# Patient Record
Sex: Male | Born: 1970
Health system: Southern US, Community
[De-identification: ages and names within clinical notes are randomized; demographics above are authoritative.]

## PROBLEM LIST (undated history)

## (undated) DIAGNOSIS — T7840XA Allergy, unspecified, initial encounter: Secondary | ICD-10-CM

## (undated) DIAGNOSIS — M722 Plantar fascial fibromatosis: Secondary | ICD-10-CM

## (undated) DIAGNOSIS — F419 Anxiety disorder, unspecified: Secondary | ICD-10-CM

## (undated) DIAGNOSIS — K219 Gastro-esophageal reflux disease without esophagitis: Secondary | ICD-10-CM

## (undated) HISTORY — DX: Plantar fascial fibromatosis: M72.2

## (undated) HISTORY — DX: Anxiety disorder, unspecified: F41.9

## (undated) HISTORY — PX: TONSILLECTOMY: SUR1361

## (undated) HISTORY — PX: OTHER SURGICAL HISTORY: SHX169

## (undated) HISTORY — DX: Allergy, unspecified, initial encounter: T78.40XA

## (undated) HISTORY — DX: Gastro-esophageal reflux disease without esophagitis: K21.9

## (undated) HISTORY — PX: SEPTOPLASTY: SUR1290

## (undated) HISTORY — PX: CHOLECYSTECTOMY: SHX55

---

## 2003-11-26 ENCOUNTER — Encounter: Admission: RE | Admit: 2003-11-26 | Discharge: 2003-11-26 | Payer: Self-pay | Admitting: Family Medicine

## 2003-12-22 ENCOUNTER — Observation Stay (HOSPITAL_COMMUNITY): Admission: EM | Admit: 2003-12-22 | Discharge: 2003-12-23 | Payer: Self-pay | Admitting: Emergency Medicine

## 2006-01-20 ENCOUNTER — Emergency Department (HOSPITAL_COMMUNITY): Admission: EM | Admit: 2006-01-20 | Discharge: 2006-01-20 | Payer: Self-pay | Admitting: Emergency Medicine

## 2007-01-03 ENCOUNTER — Encounter: Admission: RE | Admit: 2007-01-03 | Discharge: 2007-01-03 | Payer: Self-pay | Admitting: Family Medicine

## 2007-01-06 ENCOUNTER — Emergency Department (HOSPITAL_COMMUNITY): Admission: EM | Admit: 2007-01-06 | Discharge: 2007-01-06 | Payer: Self-pay | Admitting: Emergency Medicine

## 2007-12-30 ENCOUNTER — Ambulatory Visit (HOSPITAL_BASED_OUTPATIENT_CLINIC_OR_DEPARTMENT_OTHER): Admission: RE | Admit: 2007-12-30 | Discharge: 2007-12-30 | Payer: Self-pay | Admitting: Orthopedic Surgery

## 2008-07-30 ENCOUNTER — Emergency Department (HOSPITAL_BASED_OUTPATIENT_CLINIC_OR_DEPARTMENT_OTHER): Admission: EM | Admit: 2008-07-30 | Discharge: 2008-07-30 | Payer: Self-pay | Admitting: Emergency Medicine

## 2008-08-25 IMAGING — CR DG CHEST 2V
2 series · 2 of 2 positions shown · non-contrast
Comparison: None.

CLINICAL DATA: Cough, fever, and congestion. 
 CHEST - 2 VIEW:

[view not recorded (1 of 2)]
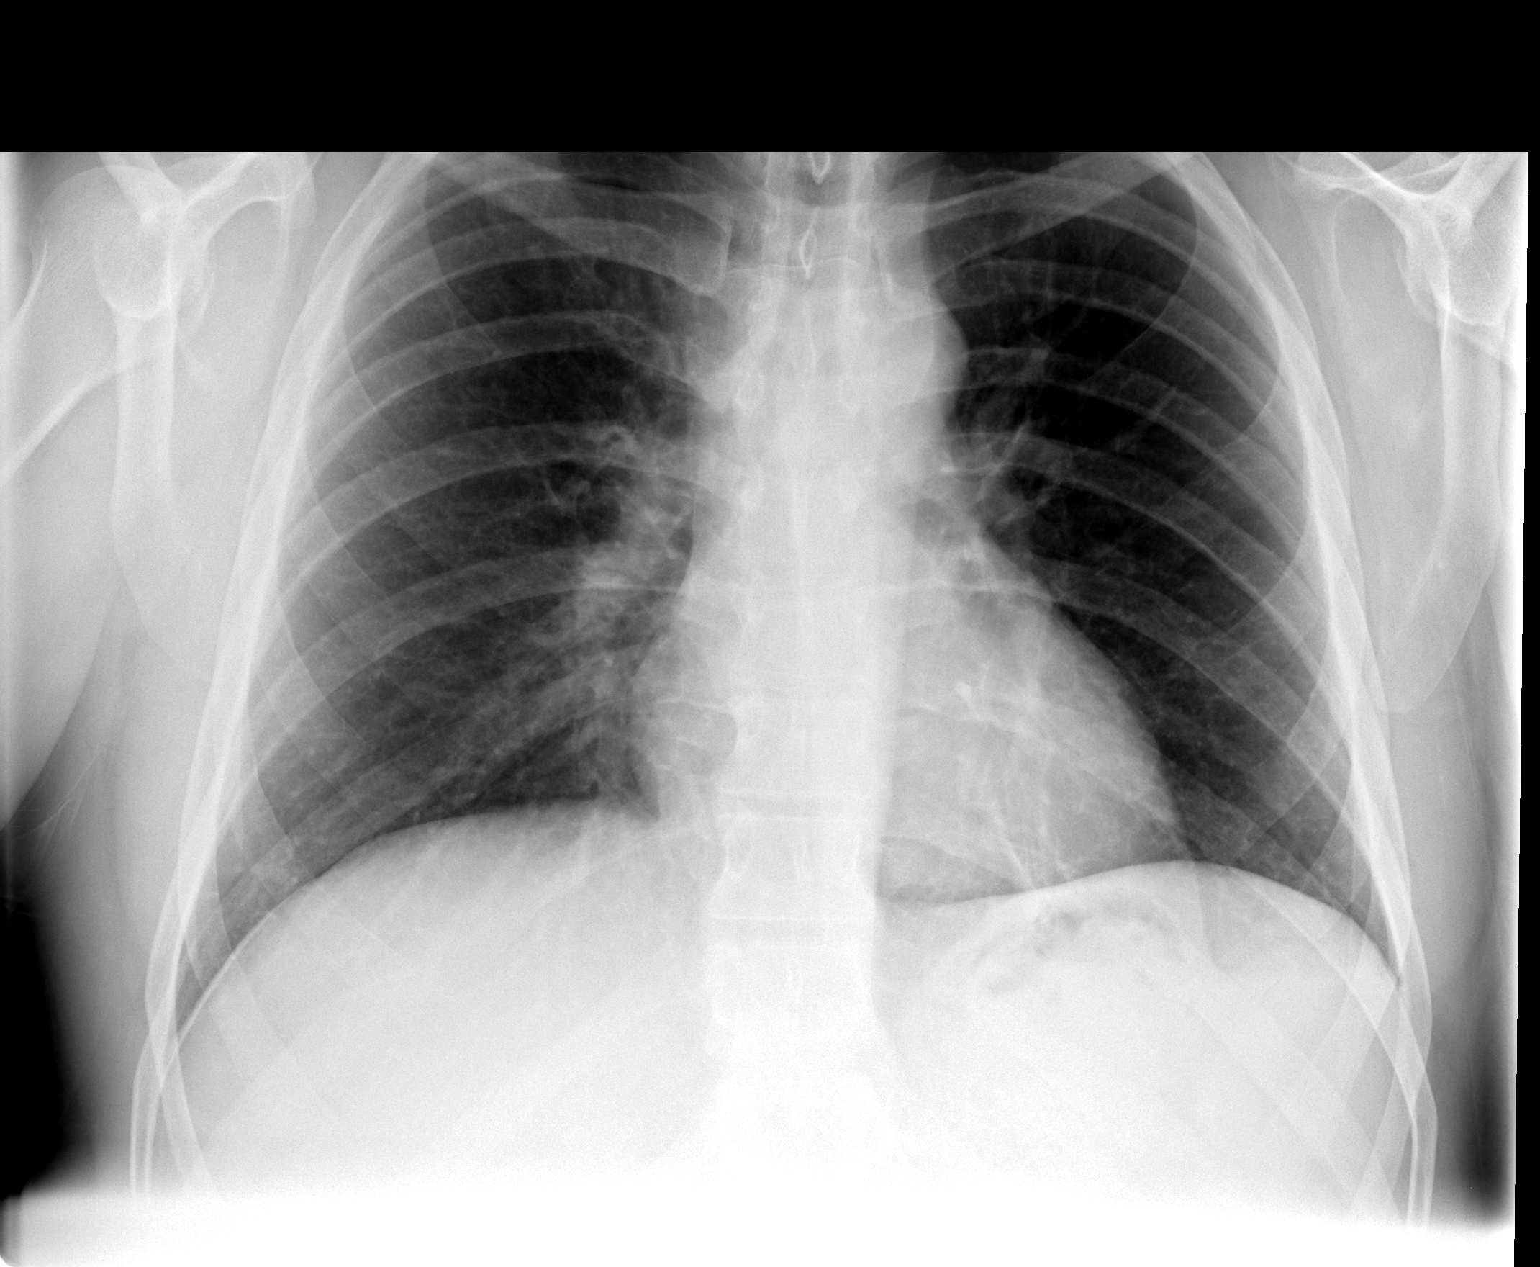

[view not recorded (2 of 2)]
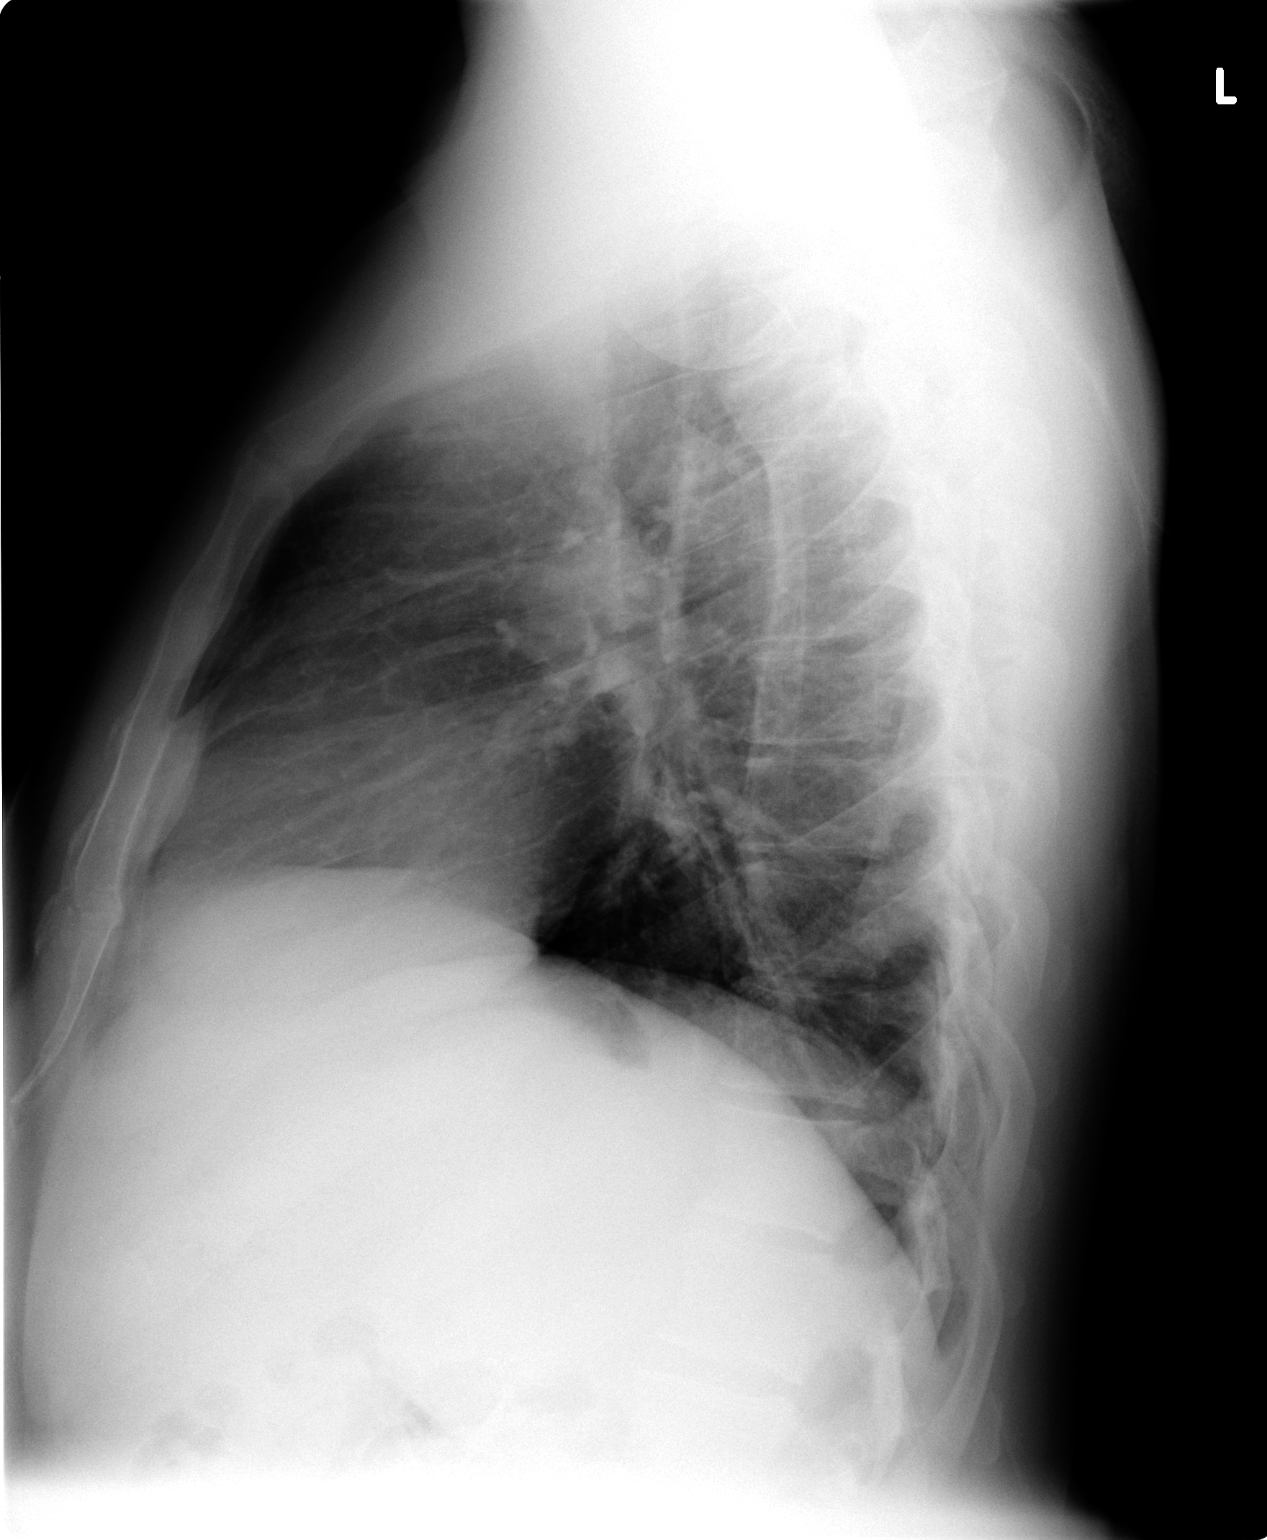

[2 of 2 positions shown; findings below may reference images not displayed]

FINDINGS: Trachea is midline.  Heart size normal.  Left lower lobe subsegmental atelectasis or scar.   Lungs otherwise clear.   No pleural fluid.
IMPRESSION: No acute findings.

## 2009-04-21 ENCOUNTER — Emergency Department (HOSPITAL_BASED_OUTPATIENT_CLINIC_OR_DEPARTMENT_OTHER): Admission: EM | Admit: 2009-04-21 | Discharge: 2009-04-21 | Payer: Self-pay | Admitting: Emergency Medicine

## 2009-04-21 ENCOUNTER — Ambulatory Visit: Payer: Self-pay | Admitting: Interventional Radiology

## 2010-12-12 IMAGING — CR DG FINGER MIDDLE 2+V*R*
3 series · 3 of 3 positions shown · non-contrast
Comparison: None

CLINICAL DATA: Laceration

RIGHT MIDDLE FINGER 2+V

[x finger pa right]
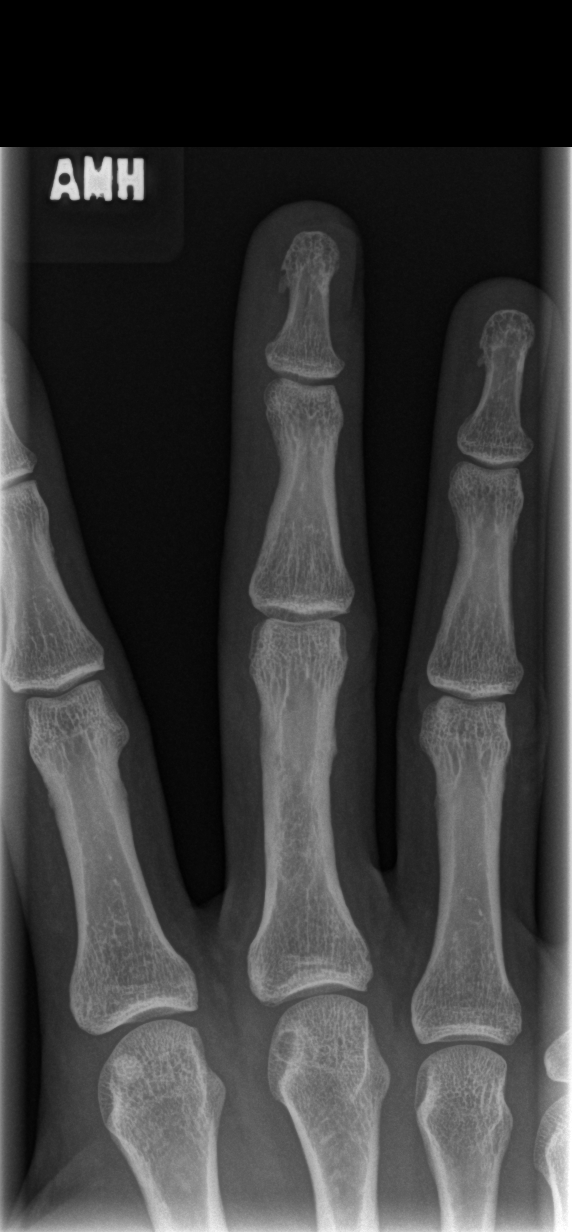

[x finger obl. right]
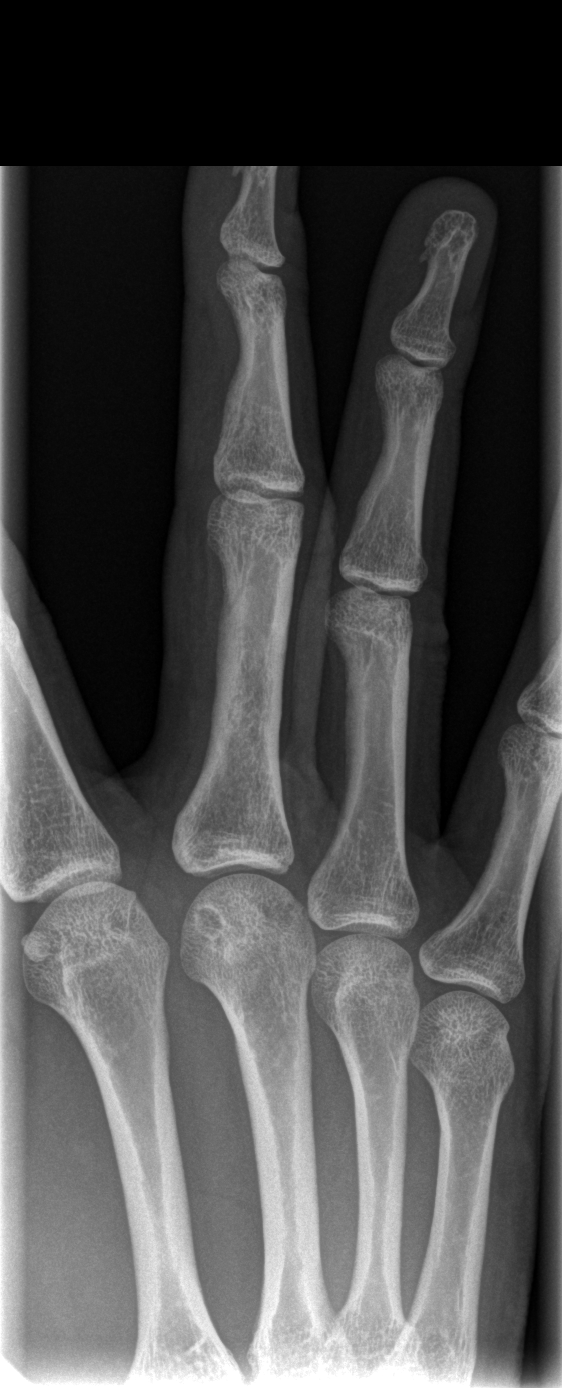

[x finger lateral right]
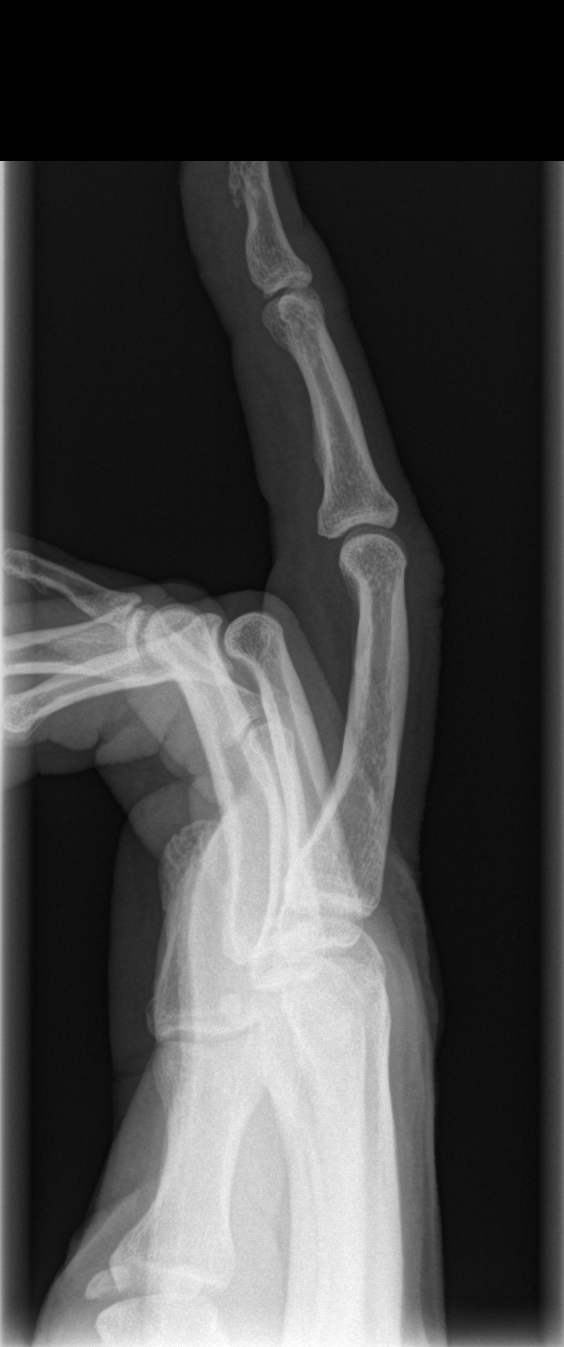

[3 of 3 positions shown; findings below may reference images not displayed]

FINDINGS: No acute fracture or dislocation.  Soft tissues are
within normal limits.
IMPRESSION: No acute bony pathology.

## 2011-03-17 NOTE — Op Note (Signed)
NAME:  Michael Hansen, Michael Hansen                  ACCOUNT NO.:  [id number]   MEDICAL RECORD NO.:  [id number]          PATIENT TYPE:  AMB   LOCATION:  DSC                          FACILITY:  [facility]   PHYSICIAN:  Katy Fitch. Sypher, M.D. DATE OF BIRTH:  09-24-1971   DATE OF PROCEDURE:  12/30/2007  DATE OF DISCHARGE:  12/30/2007                               OPERATIVE REPORT   PREOPERATIVE DIAGNOSIS:  Chronic scar contracture, right long finger,  radial aspect of proximal phalanx and index long web space.   POSTOPERATIVE DIAGNOSIS:  Chronic scar contracture, right long finger,  radial aspect of proximal phalanx and index long web space.   PROCEDURE:  Resection of full-thickness scar followed by a four limb Z-  plasty.   OPERATIONS:  Katy Fitch. Sypher, M.D.   ASSISTANT:  Molly Maduro Dasnoit, PA-C   ANESTHESIA:  2% lidocaine metacarpal head level block of right long  finger.  This was performed in the minor operating room without  sedation.   INDICATIONS:  Michael Hansen is a 40 year old gentleman who presented for  evaluation of a chronic scar contracture involving the right long finger  proximal phalangeal segment.  Several years prior, he sustained a  significant injury to the hand and developed webbing of a longitudinal  scar.   We advised him to allow the scar to mature.  He has improved the flexion  contracture of his long finger MP joint and PIP joint but still has pain  with attempted finger extension.   We had advised him in the past that he would be a proper candidate for  scar resection and Z-plasty.  He now presents requesting that a Z-plasty  be performed.   After informed consent he was brought to the minor operating room at  [facility].   PROCEDURE:  Michael Hansen was brought to the operating room and placed in  supine position on the operating table.   Following placement of a 2% lidocaine metacarpal head level block at the  metacarpal head of the right long finger, anesthesia was  satisfactory.  The right arm was prepped with Betadine soap and solution and sterilely  draped.  A pneumatic tourniquet was applied to the proximal right  brachium.   Following exsanguination of the right arm with an Esmarch bandage, the  arterial tourniquet was inflated to 230 mmHg.   The procedure commenced with a full-thickness excision of his  longitudinal scar.  This was taken down below the dermis to the  subcutaneous fat.   Care was taken to identify the radial neurovascular bundle.  The portion  of the scar extending into the web was also resected.   A four-flap Z-plasty had been planned with a marking pen preoperatively.   The limbs were incised at a 50-degree angle and subsequently undermined  and transposed.  This completely relieved the contraction of the PIP  joint and allowed recontouring of the web as the proximal flap was  turned.   Corner sutures of 5-0 nylon were used to inset the transposed Z-plasty  flaps followed by simple 5-0 nylon sutures in  the central portions of  the Z-plasty.   A very cosmetic-appearing correction of his contracture was achieved.   The wound was then dressed with Xeroflo, sterile gauze, and Ace wrap.   [name] was advised to return to our office for followup in 1 week for  partial suture removal.  We anticipate removal of all of his sutures on  January 11, 2008.   He was provided a prescription for Vicodin 5 mg one p.o. q.4-6h. p.r.n.  pain.      Katy Fitch Sypher, M.D.  Electronically Signed     RVS/MEDQ  D:  01/09/2008  T:  01/10/2008  Job:  [id number]   cc:   [name], M.D.

## 2011-03-20 NOTE — Op Note (Signed)
NAME:  Michael Hansen, Michael Hansen                            ACCOUNT NO.:  [id number]   MEDICAL RECORD NO.:  [id number]                   PATIENT TYPE:  EMS   LOCATION:  ED                                   FACILITY:  [facility]   PHYSICIAN:  Mark C. Ophelia Charter, M.D.                 DATE OF BIRTH:  05/29/1971   DATE OF PROCEDURE:  12/22/2003  DATE OF DISCHARGE:                                 OPERATIVE REPORT   PREOPERATIVE DIAGNOSES:  Right long finger crush injury with open proximal  phalanx fracture.   POSTOPERATIVE DIAGNOSES:  Right long finger crush injury with open proximal  phalanx fracture.   PROCEDURE:  Irrigation and excisional debridement of skin, subcutaneous  tissue, fascia and bone right long finger proximal phalanx. Open reduction  internal fixation and laceration closures.   SURGEON:  Mark C. Ophelia Charter, M.D.   ASSISTANT:  Sandrea Matte, P.A.   ANESTHESIA:  General.   TOURNIQUET TIME:  None.   DESCRIPTION OF PROCEDURE:  After induction of general anesthesia, a standard  Betadine scrub with Betadine solution with the proximal arm tourniquet that  was applied but not inflated. The finger was inspected, there was a stellate  shaped laceration just proximal and distal to the crease at the base of the  long finger.  The proximal phalanx proximal portion was flexed and  immediately present in the wound and there was some ground material which  appeared might represent some cinder block material which was ground in the  tip of the bone. This was irrigated copiously and then bone was debrided  with hand rongeur. This piece of the proximal phalanx did not have any  contamination but it was meticulously irrigated and inspected. The flexor  tendons were intact and AP pulley was intact, and MCP's flexed with distal  traction with the PIP slightly flexed.  Reduction was performed, an attempt  was made from the opening of the volar aspect to place a K wire holding it  reduced however cortical bone was  hard and this actually displaced some.  This was backed out of the proximal fragment to help control it and  reduction was performed. With some difficulty several attempts were made to  cross over it and there was still residual angulation.  _________ opened and  a bone holding clamp was used to help push the fragment and initial cross  pin was inserted with excellent position in the AP and lateral.  A second  cross pin was inserted and then adjusted.  There was good alignment,  rotation was inspected and was good.  Circulation in the tip was intact and  the neurovascular bundle on the radial side was inspected and was intact.  Pins were cut and a retractor was applied, 5-0 nylon was used for closure of  the laceration and was cleaned with wet and then dried.  A short arm splint  was applied with fluffs, Webril, splints and an Ace wrap.  The wrist was  extended, MCP flexed and rotation and circulation of finger was good.  The  patient will stay overnight for observation and IV antibiotics to be  discharged in the morning.                                               Mark C. Ophelia Charter, M.D.    MCY/MEDQ  D:  12/22/2003  T:  12/22/2003  Job:  [id number]

## 2013-09-12 DIAGNOSIS — F4320 Adjustment disorder, unspecified: Secondary | ICD-10-CM | POA: Insufficient documentation

## 2014-05-15 DIAGNOSIS — F411 Generalized anxiety disorder: Secondary | ICD-10-CM | POA: Insufficient documentation

## 2014-05-15 DIAGNOSIS — F419 Anxiety disorder, unspecified: Secondary | ICD-10-CM | POA: Insufficient documentation

## 2016-11-27 DIAGNOSIS — M25522 Pain in left elbow: Secondary | ICD-10-CM | POA: Diagnosis not present

## 2016-12-01 DIAGNOSIS — M25522 Pain in left elbow: Secondary | ICD-10-CM | POA: Diagnosis not present

## 2017-02-03 DIAGNOSIS — S60042A Contusion of left ring finger without damage to nail, initial encounter: Secondary | ICD-10-CM | POA: Diagnosis not present

## 2017-10-04 DIAGNOSIS — J329 Chronic sinusitis, unspecified: Secondary | ICD-10-CM | POA: Diagnosis not present

## 2017-10-28 DIAGNOSIS — R0683 Snoring: Secondary | ICD-10-CM | POA: Insufficient documentation

## 2017-10-28 DIAGNOSIS — K219 Gastro-esophageal reflux disease without esophagitis: Secondary | ICD-10-CM | POA: Diagnosis not present

## 2017-10-28 DIAGNOSIS — J019 Acute sinusitis, unspecified: Secondary | ICD-10-CM | POA: Diagnosis not present

## 2017-11-17 DIAGNOSIS — K219 Gastro-esophageal reflux disease without esophagitis: Secondary | ICD-10-CM | POA: Diagnosis not present

## 2017-11-17 DIAGNOSIS — R490 Dysphonia: Secondary | ICD-10-CM | POA: Insufficient documentation

## 2018-02-21 DIAGNOSIS — E781 Pure hyperglyceridemia: Secondary | ICD-10-CM | POA: Diagnosis not present

## 2018-02-21 DIAGNOSIS — Z0000 Encounter for general adult medical examination without abnormal findings: Secondary | ICD-10-CM | POA: Diagnosis not present

## 2018-07-11 ENCOUNTER — Other Ambulatory Visit: Payer: Self-pay

## 2018-07-11 ENCOUNTER — Encounter: Payer: Self-pay | Admitting: Podiatry

## 2018-07-11 ENCOUNTER — Ambulatory Visit (INDEPENDENT_AMBULATORY_CARE_PROVIDER_SITE_OTHER): Payer: 59

## 2018-07-11 ENCOUNTER — Ambulatory Visit (INDEPENDENT_AMBULATORY_CARE_PROVIDER_SITE_OTHER): Payer: 59 | Admitting: Podiatry

## 2018-07-11 VITALS — BP 130/80 | HR 50 | Resp 15 | Ht 75.0 in | Wt 230.0 lb

## 2018-07-11 DIAGNOSIS — M216X9 Other acquired deformities of unspecified foot: Secondary | ICD-10-CM

## 2018-07-11 DIAGNOSIS — M79672 Pain in left foot: Secondary | ICD-10-CM

## 2018-07-11 DIAGNOSIS — M7732 Calcaneal spur, left foot: Secondary | ICD-10-CM

## 2018-07-11 DIAGNOSIS — M7752 Other enthesopathy of left foot: Secondary | ICD-10-CM

## 2018-07-11 DIAGNOSIS — M722 Plantar fascial fibromatosis: Secondary | ICD-10-CM | POA: Diagnosis not present

## 2018-07-11 MED ORDER — MELOXICAM 7.5 MG PO TABS: 7.5000 mg | ORAL_TABLET | Freq: Every day | ORAL | 0 refills | Status: DC

## 2018-07-11 NOTE — Addendum Note (Signed)
Addended by: Dinah Beers on: 07/11/2018 11:26 AM   Modules accepted: Orders

## 2018-07-11 NOTE — Patient Instructions (Signed)

## 2018-07-11 NOTE — Progress Notes (Signed)
Subjective:  Patient ID: Michael Hansen, male    DOB: Jul 20, 1971,  MRN: [id number]  No chief complaint on file.  47 y.o. male presents with L heel pain. Reports pain to the bottom of the left heel for several months. Denies injury. Uses OTC inserts and Ibuprofen. Pain worst in the AM.   Review of Systems: Negative except as noted in the HPI. Denies N/V/F/Ch.  No past medical history on file.  Current Outpatient Medications:  .  clorazepate (TRANXENE) 3.75 MG tablet, Take 1/2 to 1 tab twice a day as needed for anxiety, Disp: , Rfl:  .  escitalopram (LEXAPRO) 10 MG tablet, TAKE 1 TABLET BY MOUTH EVERY DAY, Disp: , Rfl:  .  escitalopram (LEXAPRO) 10 MG tablet, Take 10 mg by mouth daily., Disp: , Rfl: 1 .  esomeprazole (NEXIUM) 40 MG capsule, , Disp: , Rfl:  .  famotidine (PEPCID) 40 MG tablet, , Disp: , Rfl:  .  fexofenadine (ALLEGRA) 180 MG tablet, Take by mouth., Disp: , Rfl:  .  fluticasone (FLONASE) 50 MCG/ACT nasal spray, Place into the nose., Disp: , Rfl:  .  meloxicam (MOBIC) 7.5 MG tablet, Take 1 tablet (7.5 mg total) by mouth daily., Disp: 30 tablet, Rfl: 0 .  metoCLOPramide (REGLAN) 10 MG tablet, , Disp: , Rfl:  .  omeprazole (PRILOSEC) 40 MG capsule, TAKE 1 CAPSULE (40 MG TOTAL) BY MOUTH 2 TIMES DAILY., Disp: , Rfl: 11 .  OXcarbazepine (TRILEPTAL) 150 MG tablet, take 1 tab twice a day, Disp: , Rfl:  .  venlafaxine XR (EFFEXOR-XR) 75 MG 24 hr capsule, Take 1 a day, Disp: , Rfl:   Social History   Tobacco Use  Smoking Status Not on file    Allergies  Allergen Reactions  . Doxycycline Rash  . Levofloxacin Rash   Objective:  There were no vitals filed for this visit. There is no height or weight on file to calculate BMI. Constitutional Well developed. Well nourished.  Vascular Dorsalis pedis pulses palpable bilaterally. Posterior tibial pulses palpable bilaterally. Capillary refill normal to all digits.  No cyanosis or clubbing noted. Pedal hair growth normal.    Neurologic Normal speech. Oriented to person, place, and time. Epicritic sensation to light touch grossly present bilaterally.  Dermatologic Nails well groomed and normal in appearance. No open wounds. No skin lesions.  Orthopedic: Normal joint ROM without pain or crepitus bilaterally. No visible deformities. Tender to palpation at the calcaneal tuber left. No pain with calcaneal squeeze left. Ankle ROM diminished range of motion left. Silfverskiold Test: positive left.   Radiographs: Taken and reviewed. No acute fractures or dislocations. No evidence of stress fracture.  Plantar heel spur present. Posterior heel spur present.  Assessment:   1. Plantar fasciitis   2. Equinus deformity of foot   3. Heel spur, left   4. Pain of left heel   5. Bursitis of heel, left    Plan:  Patient was evaluated and treated and all questions answered.  Plantar Fasciitis, left - XR reviewed as above.  - Educated on icing and stretching. Instructions given.  - Injection delivered to the plantar fascia as below. - Pharmacologic management: Meloxicam. Educated on risks/benefits and proper taking of medication. Will only rx half dose due to GERD.  Procedure: Injection Tendon/Ligament Location: Left plantar fascia at the glabrous junction; medial approach. Skin Prep: alcohol Injectate: 1 cc 0.5% marcaine plain, 1 cc dexamethasone phosphate, 0.5 cc kenalog 10. Disposition: Patient tolerated procedure well. Injection site dressed  with a band-aid.  Return in about 3 weeks (around 08/01/2018) for Plantar fasciitis, Left.

## 2018-07-15 ENCOUNTER — Other Ambulatory Visit: Payer: Self-pay | Admitting: Podiatry

## 2018-07-15 DIAGNOSIS — M722 Plantar fascial fibromatosis: Secondary | ICD-10-CM

## 2018-07-15 DIAGNOSIS — M7732 Calcaneal spur, left foot: Secondary | ICD-10-CM

## 2018-07-15 DIAGNOSIS — M216X9 Other acquired deformities of unspecified foot: Secondary | ICD-10-CM

## 2018-07-15 DIAGNOSIS — M7752 Other enthesopathy of left foot: Secondary | ICD-10-CM

## 2018-07-15 DIAGNOSIS — M79672 Pain in left foot: Secondary | ICD-10-CM

## 2018-08-01 ENCOUNTER — Ambulatory Visit (INDEPENDENT_AMBULATORY_CARE_PROVIDER_SITE_OTHER): Payer: 59 | Admitting: Podiatry

## 2018-08-01 DIAGNOSIS — M722 Plantar fascial fibromatosis: Secondary | ICD-10-CM

## 2018-08-01 NOTE — Progress Notes (Signed)
  Subjective:  Patient ID: Michael Hansen, male    DOB: [birth year],  MRN: [id number]  Chief Complaint  Patient presents with  . Plantar Fasciitis    F?U L PF Pt. stated," It's better, a lot better; 2/10 intermittent pain." Tx: meloxicam and stretching   47 y.o. male presents for f/u L heel pain. States it's doing a lot better. Reports 2/10 intermittent pain. Stretching as directed. Meloxicam helps.  Review of Systems: Negative except as noted in the HPI. Denies N/V/F/Ch.  No past medical history on file.  Current Outpatient Medications:  .  clorazepate (TRANXENE) 3.75 MG tablet, Take 1/2 to 1 tab twice a day as needed for anxiety, Disp: , Rfl:  .  escitalopram (LEXAPRO) 10 MG tablet, Take 10 mg by mouth daily., Disp: , Rfl: 1 .  esomeprazole (NEXIUM) 40 MG capsule, , Disp: , Rfl:  .  famotidine (PEPCID) 40 MG tablet, , Disp: , Rfl:  .  fexofenadine (ALLEGRA) 180 MG tablet, Take by mouth., Disp: , Rfl:  .  fluticasone (FLONASE) 50 MCG/ACT nasal spray, Place into the nose., Disp: , Rfl:  .  meloxicam (MOBIC) 7.5 MG tablet, Take 1 tablet (7.5 mg total) by mouth daily., Disp: 30 tablet, Rfl: 0 .  omeprazole (PRILOSEC) 40 MG capsule, TAKE 1 CAPSULE (40 MG TOTAL) BY MOUTH 2 TIMES DAILY., Disp: , Rfl: 11 .  OXcarbazepine (TRILEPTAL) 150 MG tablet, take 1 tab twice a day, Disp: , Rfl:   Social History   Tobacco Use  Smoking Status Never Smoker  Smokeless Tobacco Never Used    Allergies  Allergen Reactions  . Doxycycline Rash  . Levofloxacin Rash   Objective:  There were no vitals filed for this visit. There is no height or weight on file to calculate BMI. Constitutional Well developed. Well nourished.  Vascular Dorsalis pedis pulses palpable bilaterally. Posterior tibial pulses palpable bilaterally. Capillary refill normal to all digits.  No cyanosis or clubbing noted. Pedal hair growth normal.  Neurologic Normal speech. Oriented to person, place, and time. Epicritic sensation  to light touch grossly present bilaterally.  Dermatologic Nails well groomed and normal in appearance. No open wounds. No skin lesions.  Orthopedic: Normal joint ROM without pain or crepitus bilaterally. No visible deformities. Tender to palpation at the calcaneal tuber left. No pain with calcaneal squeeze left. Ankle ROM diminished range of motion left. Silfverskiold Test: positive left.   Radiographs:  9/9: No acute fractures or dislocations. No evidence of stress fracture.  Plantar heel spur present. Posterior heel spur present.  Assessment:   1. Plantar fasciitis    Plan:  Patient was evaluated and treated and all questions answered.  Plantar Fasciitis, left - Plantar fascial rest brace dispensed. Medically necessary for support of the plantar fascia. - Injection #2 as below.  Procedure: Injection Tendon/Ligament Consent: Verbal consent obtained. Location: Left plantar fascia at the glabrous junction; medial approach. Skin Prep: Alcohol. Injectate: 1 cc 0.5% marcaine plain, 1 cc dexamethasone phosphate, 0.5 cc kenalog 10. Disposition: Patient tolerated procedure well. Injection site dressed with a band-aid.  No follow-ups on file.

## 2018-10-05 DIAGNOSIS — M25521 Pain in right elbow: Secondary | ICD-10-CM | POA: Insufficient documentation

## 2018-10-05 DIAGNOSIS — M7711 Lateral epicondylitis, right elbow: Secondary | ICD-10-CM | POA: Diagnosis not present

## 2019-01-13 DIAGNOSIS — R05 Cough: Secondary | ICD-10-CM | POA: Diagnosis not present

## 2019-02-09 DIAGNOSIS — Z6831 Body mass index (BMI) 31.0-31.9, adult: Secondary | ICD-10-CM | POA: Diagnosis not present

## 2019-03-13 DIAGNOSIS — Z566 Other physical and mental strain related to work: Secondary | ICD-10-CM | POA: Diagnosis not present

## 2019-04-14 ENCOUNTER — Telehealth: Payer: Self-pay | Admitting: Gastroenterology

## 2019-04-14 NOTE — Telephone Encounter (Signed)
He is a friend of CRNA Josh Monday from [facility]. Has been having what may be GERD related symptoms.  He needs NGI with me next week or the week after, offer telemed or inpatient (whichever he prefers) double book if needed with me.  Thanks

## 2019-04-14 NOTE — Telephone Encounter (Signed)
Left him a detailed message to call back and set up an appointment.

## 2019-04-14 NOTE — Telephone Encounter (Signed)
Patient has been set up for a ZOOM visit 04/18/2019 at [facility].

## 2019-04-18 ENCOUNTER — Ambulatory Visit (INDEPENDENT_AMBULATORY_CARE_PROVIDER_SITE_OTHER): Payer: 59 | Admitting: Gastroenterology

## 2019-04-18 ENCOUNTER — Encounter: Payer: Self-pay | Admitting: Gastroenterology

## 2019-04-18 ENCOUNTER — Other Ambulatory Visit: Payer: Self-pay

## 2019-04-18 VITALS — Ht 75.0 in | Wt 230.0 lb

## 2019-04-18 DIAGNOSIS — R490 Dysphonia: Secondary | ICD-10-CM

## 2019-04-18 DIAGNOSIS — K219 Gastro-esophageal reflux disease without esophagitis: Secondary | ICD-10-CM

## 2019-04-18 NOTE — Progress Notes (Signed)
This service was provided via virtual visit.  Both audio and visual were used.    The patient was located at home.  I was located in my office.  The patient did consent to this virtual visit and is aware of possible charges through their insurance for this visit.  The patient is a new patient.  My certified medical assistant, Grace Bushy, contributed to this visit by contacting the patient by phone 1 or 2 business days prior to the appointment and also followed up on the recommendations I made after the visit.  Time spent on virtual visit: 23 min   HPI: This is a very pleasant 48 year old man whom I met today via telemedicine visit  He saw an ear nose and throat physician through [facility] January 2019.  He was having hoarseness that would come and go.  Examination by ENT seems normal except for "3+ arytenoid edema and swelling in the intraarytenoid area."  The physician "reassured him that nothing scary is going on.  I assume this is reflux "his proton pump inhibitor was increased to twice daily he was told to follow-up as needed.  At least once per year, sometimes twice. He will completely lose his voice.  Has been going on for 5 years.  No pattern about time of year.  He is hoarse for about 2 weeks with zero voice.  Never had pyrosis however he's been on antiacid meds for more than 5 years.  Currently omeprazole 56m pills, takes one in AM usually 10-20 minutes before breakfast.  PM pill is at bedtime.   He is not on pepcid or nexium currently.  He's never had EGD.  No dysphagia.  Overall stable weight.  Caffeine; drinks energy drinks, usually 2 per day.  Some in evening.  Etoh; 3-4 nights per week (1-2 beers per night).   Chief complaint is hoarseness, question GERD  ROS: complete GI ROS as described in HPI, all other review negative.  Constitutional:  No unintentional weight loss   Past Medical History:  Diagnosis Date  . Anxiety   . GERD (gastroesophageal reflux  disease)   . Plantar fasciitis     History reviewed. No pertinent surgical history.  Current Outpatient Medications  Medication Sig Dispense Refill  . clorazepate (TRANXENE) 3.75 MG tablet Take 1/2 to 1 tab twice a day as needed for anxiety    . escitalopram (LEXAPRO) 10 MG tablet Take 10 mg by mouth daily.  1  . esomeprazole (NEXIUM) 40 MG capsule     . famotidine (PEPCID) 40 MG tablet     . fexofenadine (ALLEGRA) 180 MG tablet Take by mouth.    . fluticasone (FLONASE) 50 MCG/ACT nasal spray Place into the nose.    . meloxicam (MOBIC) 7.5 MG tablet Take 1 tablet (7.5 mg total) by mouth daily. 30 tablet 0  . omeprazole (PRILOSEC) 40 MG capsule TAKE 1 CAPSULE (40 MG TOTAL) BY MOUTH 2 TIMES DAILY.  11  . OXcarbazepine (TRILEPTAL) 150 MG tablet take 1 tab twice a day     No current facility-administered medications for this visit.     Allergies as of 04/18/2019 - Review Complete 04/17/2019  Allergen Reaction Noted  . Doxycycline Rash 01/27/2014  . Levofloxacin Rash 01/27/2014    History reviewed. No pertinent family history.  Social History   Socioeconomic History  . Marital status: Married    Spouse name: Not on file  . Number of children: Not on file  . Years of education: Not  on file  . Highest education level: Not on file  Occupational History  . Not on file  Social Needs  . Financial resource strain: Not on file  . Food insecurity    Worry: Not on file    Inability: Not on file  . Transportation needs    Medical: Not on file    Non-medical: Not on file  Tobacco Use  . Smoking status: Never Smoker  . Smokeless tobacco: Never Used  Substance and Sexual Activity  . Alcohol use: Not on file  . Drug use: Not on file  . Sexual activity: Not on file  Lifestyle  . Physical activity    Days per week: Not on file    Minutes per session: Not on file  . Stress: Not on file  Relationships  . Social Herbalist on phone: Not on file    Gets together: Not on  file    Attends religious service: Not on file    Active member of club or organization: Not on file    Attends meetings of clubs or organizations: Not on file    Relationship status: Not on file  . Intimate partner violence    Fear of current or ex partner: Not on file    Emotionally abused: Not on file    Physically abused: Not on file    Forced sexual activity: Not on file  Other Topics Concern  . Not on file  Social History Narrative  . Not on file     Physical Exam: Unable to perform because this was a "telemed visit" due to current Covid-19 pandemic  Assessment and plan: 48 y.o. male with intermittent hoarseness, question GERD  Certainly has intermittent hoarseness may be related to GERD.  He is not taking proton pump inhibitors at the correct time in relation to meals and so I educated him about that.  I asked him to also start taking an H2 blocker at bedtime every night.  He understands that this is maximum medical acid suppression.  I recommended an upper endoscopy to check for significant GERD damage, perhaps a large hiatal hernia which would predispose him to getting acid regurg.  I recommended he try not drinking caffeinated beverages in the p.m. hours.  I also discussed cutting back on alcohol as this can certainly contribute to GERD.  Please see the "Patient Instructions" section for addition details about the plan.  Owens Loffler, MD [facility] 04/18/2019, 7:54 AM

## 2019-04-18 NOTE — Patient Instructions (Addendum)
He knows to change the way he is taking his omeprazole so that he is taking  A 40 mg pill 20 to 30 minutes before breakfast meal and also 20 to 30 minutes before his dinner meal.  He will also start Pepcid, famotidine 20 mg pill 1 pill at bedtime every night  We will arrange for upper endoscopy at his soonest convenience for hoarseness, GERD?  He will cut back caffeine especially in the evening hours.  He will consider cutting back alcohol as well.  Thank you for entrusting me with your care and choosing [facility].  Dr Ardis Hughs

## 2019-05-01 ENCOUNTER — Telehealth: Payer: Self-pay | Admitting: Gastroenterology

## 2019-05-01 NOTE — Telephone Encounter (Signed)
LM on vmail to call back regarding Covid-19 screening questions °Covid-19 Screening Questions ° °Do you now or have you had a fever in the last 14 days?      ° °Do you have any respiratory symptoms of shortness of breath or cough now or in the last 14 days?     ° °Do you have any family members or close contacts with diagnosed or suspected Covid-19 in the past 14 days?      ° °Have you been tested for Covid-19 and found to be positive?     ° °  ° °  °

## 2019-05-01 NOTE — Telephone Encounter (Signed)
No to all answer °

## 2019-05-02 ENCOUNTER — Encounter: Payer: Self-pay | Admitting: Gastroenterology

## 2019-05-02 ENCOUNTER — Other Ambulatory Visit: Payer: Self-pay

## 2019-05-02 ENCOUNTER — Ambulatory Visit (AMBULATORY_SURGERY_CENTER): Payer: 59 | Admitting: Gastroenterology

## 2019-05-02 VITALS — BP 124/88 | HR 53 | Temp 98.0°F | Resp 19 | Ht 75.0 in | Wt 230.0 lb

## 2019-05-02 DIAGNOSIS — K219 Gastro-esophageal reflux disease without esophagitis: Secondary | ICD-10-CM | POA: Diagnosis present

## 2019-05-02 DIAGNOSIS — R490 Dysphonia: Secondary | ICD-10-CM | POA: Diagnosis not present

## 2019-05-02 HISTORY — PX: UPPER GASTROINTESTINAL ENDOSCOPY: SHX188

## 2019-05-02 MED ORDER — SODIUM CHLORIDE 0.9 % IV SOLN: 500.0000 mL | INTRAVENOUS | Status: DC

## 2019-05-02 NOTE — Op Note (Signed)
[facility] Patient Name: Michael Hansen Procedure Date: 05/02/2019 2:18 PM MRN: [id number] Endoscopist: Milus Banister , MD Age: 48 Referring MD:  Date of Birth: [birth year] Gender: Male Account #: [id number] Procedure:                Upper GI endoscopy Indications:              Intermittent hoarseness, ENT evaluation felt it to                            be acid related Medicines:                Monitored Anesthesia Care Procedure:                Pre-Anesthesia Assessment:                           - Prior to the procedure, a History and Physical                            was performed, and patient medications and                            allergies were reviewed. The patient's tolerance of                            previous anesthesia was also reviewed. The risks                            and benefits of the procedure and the sedation                            options and risks were discussed with the patient.                            All questions were answered, and informed consent                            was obtained. Prior Anticoagulants: The patient has                            taken no previous anticoagulant or antiplatelet                            agents. ASA Grade Assessment: II - A patient with                            mild systemic disease. After reviewing the risks                            and benefits, the patient was deemed in                            satisfactory condition to undergo the procedure.  After obtaining informed consent, the endoscope was                            passed under direct vision. Throughout the                            procedure, the patient's blood pressure, pulse, and                            oxygen saturations were monitored continuously. The                            Model GIF-HQ190 [id number]) scope was introduced                            through the mouth, and advanced to the  second part                            of duodenum. The upper GI endoscopy was                            accomplished without difficulty. The patient                            tolerated the procedure well. Scope In: Scope Out: Findings:                 The esophagus was normal.                           The stomach was normal.                           The examined duodenum was normal. Complications:            No immediate complications. Estimated blood loss:                            None. Estimated Blood Loss:     Estimated blood loss: none. Impression:               - Normal UGI tract. No obvious acid related damage                            to the esophagus however I do think he's been                            having acid related symptoms (intermittent                            hoarseness). Recommendation:           - Patient has a contact number available for                            emergencies. The signs and symptoms of potential  delayed complications were discussed with the                            patient. Return to normal activities tomorrow.                            Written discharge instructions were provided to the                            patient.                           - Resume previous diet.                           - Continue present medications; omeprazole 40mg                             pill, one pill before breakfast and dinner meals.                            Pepcid 20mg  pill, one pill at bedtime nightly.                            Avoid PM caffeine.                           - Call Dr. Christella HartiganJacobs' office in 7-8 weeks to update on                            your symptoms. Rachael Feeaniel P Tullio Chausse, MD 05/02/2019 2:30:28 PM This report has been signed electronically.

## 2019-05-02 NOTE — Progress Notes (Signed)
VSS No apparent complications  Report to PACU RN

## 2019-05-02 NOTE — Patient Instructions (Signed)
Call Dr Ardis Hughs' office in 7-8 weeks to update your symptoms Please continue your pepcid and omeprazole *See report for details Please call 24/7 phone number circled in red below for the symptoms listed    YOU HAD AN ENDOSCOPIC PROCEDURE TODAY AT [facility]:   Refer to the procedure report that was given to you for any specific questions about what was found during the examination.  If the procedure report does not answer your questions, please call your gastroenterologist to clarify.  If you requested that your care partner not be given the details of your procedure findings, then the procedure report has been included in a sealed envelope for you to review at your convenience later.  YOU SHOULD EXPECT: Some feelings of bloating in the abdomen. Passage of more gas than usual.  Walking can help get rid of the air that was put into your GI tract during the procedure and reduce the bloating. If you had a lower endoscopy (such as a colonoscopy or flexible sigmoidoscopy) you may notice spotting of blood in your stool or on the toilet paper. If you underwent a bowel prep for your procedure, you may not have a normal bowel movement for a few days.  Please Note:  You might notice some irritation and congestion in your nose or some drainage.  This is from the oxygen used during your procedure.  There is no need for concern and it should clear up in a day or so.  SYMPTOMS TO REPORT IMMEDIATELY:    Following upper endoscopy (EGD)  Vomiting of blood or coffee ground material  New chest pain or pain under the shoulder blades  Painful or persistently difficult swallowing  New shortness of breath  Fever of 100F or higher  Black, tarry-looking stools  For urgent or emergent issues, a gastroenterologist can be reached at any hour by calling [phone].   DIET:  We do recommend a small meal at first, but then you may proceed to your regular diet.  Drink plenty of fluids but you should  avoid alcoholic beverages for 24 hours.  ACTIVITY:  You should plan to take it easy for the rest of today and you should NOT DRIVE or use heavy machinery until tomorrow (because of the sedation medicines used during the test).    FOLLOW UP: Our staff will call the number listed on your records 48-72 hours following your procedure to check on you and address any questions or concerns that you may have regarding the information given to you following your procedure. If we do not reach you, we will leave a message.  We will attempt to reach you two times.  During this call, we will ask if you have developed any symptoms of COVID 19. If you develop any symptoms (ie: fever, flu-like symptoms, shortness of breath, cough etc.) before then, please call [phone].  If you test positive for Covid 19 in the 2 weeks post procedure, please call and report this information to [place].    If any biopsies were taken you will be contacted by phone or by letter within the next 1-3 weeks.  Please call us at [phone] if you have not heard about the biopsies in 3 weeks.    SIGNATURES/CONFIDENTIALITY: You and/or your care partner have signed paperwork which will be entered into your electronic medical record.  These signatures attest to the fact that that the information above on your After Visit Summary has been reviewed and is understood.  Full responsibility of the confidentiality of this discharge information lies with you and/or your care-partner.

## 2019-05-04 ENCOUNTER — Telehealth: Payer: Self-pay | Admitting: *Deleted

## 2019-05-04 NOTE — Telephone Encounter (Signed)
  Follow up Call-  Call back number 05/02/2019  Post procedure Call Back phone  # [phone]  Permission to leave phone message Yes  Some recent data might be hidden     Patient questions:  Do you have a fever, pain , or abdominal swelling? No. Pain Score  0 *  Have you tolerated food without any problems? Yes.    Have you been able to return to your normal activities? Yes.    Do you have any questions about your discharge instructions: Diet   No. Medications  No. Follow up visit  No.  Do you have questions or concerns about your Care? No.  Actions: * If pain score is 4 or above: No action needed, pain <4.  1. Have you developed a fever since your procedure? NO  2.   Have you had an respiratory symptoms (SOB or cough) since your procedure? NO  3.   Have you tested positive for COVID 19 since your procedure NO  4.   Have you had any family members/close contacts diagnosed with the COVID 19 since your procedure?  NO   If yes to any of these questions please route to Joylene John, RN and Alphonsa Gin, RN.

## 2020-01-29 ENCOUNTER — Ambulatory Visit (AMBULATORY_SURGERY_CENTER): Payer: 59 | Admitting: *Deleted

## 2020-01-29 ENCOUNTER — Other Ambulatory Visit: Payer: Self-pay

## 2020-01-29 VITALS — Temp 97.1°F | Ht 75.0 in | Wt 258.0 lb

## 2020-01-29 DIAGNOSIS — Z01818 Encounter for other preprocedural examination: Secondary | ICD-10-CM

## 2020-01-29 DIAGNOSIS — Z1211 Encounter for screening for malignant neoplasm of colon: Secondary | ICD-10-CM

## 2020-01-29 MED ORDER — NA SULFATE-K SULFATE-MG SULF 17.5-3.13-1.6 GM/177ML PO SOLN: ORAL | 0 refills | Status: DC

## 2020-01-29 NOTE — Progress Notes (Signed)
Patient is here in-person for PV. Patient denies any allergies to eggs or soy. Patient denies any problems with anesthesia/sedation. Patient denies any oxygen use at home. Patient denies taking any diet/weight loss medications or blood thinners. Patient is not being treated for MRSA or C-diff. EMMI education assisgned to the patient for the procedure, this was explained and instructions given to patient. COVID-19 screening test is on 4/6, the pt is aware.  Patient is aware of our care-partner policy and Covid-19 safety protocol.   Prep Prescription coupon given to the patient. 

## 2020-02-06 ENCOUNTER — Ambulatory Visit (INDEPENDENT_AMBULATORY_CARE_PROVIDER_SITE_OTHER): Payer: 59

## 2020-02-06 ENCOUNTER — Other Ambulatory Visit: Payer: Self-pay | Admitting: Gastroenterology

## 2020-02-06 DIAGNOSIS — Z1159 Encounter for screening for other viral diseases: Secondary | ICD-10-CM

## 2020-02-07 LAB — SARS CORONAVIRUS 2 (TAT 6-24 HRS): SARS Coronavirus 2: NEGATIVE

## 2020-02-08 ENCOUNTER — Encounter: Payer: Self-pay | Admitting: Gastroenterology

## 2020-02-09 ENCOUNTER — Encounter: Payer: Self-pay | Admitting: Gastroenterology

## 2020-02-09 ENCOUNTER — Ambulatory Visit (AMBULATORY_SURGERY_CENTER): Payer: 59 | Admitting: Gastroenterology

## 2020-02-09 ENCOUNTER — Other Ambulatory Visit: Payer: Self-pay

## 2020-02-09 VITALS — BP 137/93 | HR 62 | Temp 97.3°F | Resp 23 | Ht 75.0 in | Wt 258.0 lb

## 2020-02-09 DIAGNOSIS — Z1211 Encounter for screening for malignant neoplasm of colon: Secondary | ICD-10-CM

## 2020-02-09 MED ORDER — SODIUM CHLORIDE 0.9 % IV SOLN: 500.0000 mL | Freq: Once | INTRAVENOUS | Status: DC

## 2020-02-09 NOTE — Op Note (Signed)
[facility] Patient Name: Phyllip Claw Procedure Date: 02/09/2020 8:31 AM MRN: [id number] Endoscopist: Rachael Fee , MD Age: 49 Referring MD:  Date of Birth: 06-06-1971 Gender: Male Account #: [id number] Procedure:                Colonoscopy Indications:              Screening for colorectal malignant neoplasm Medicines:                Monitored Anesthesia Care Procedure:                Pre-Anesthesia Assessment:                           - Prior to the procedure, a History and Physical                            was performed, and patient medications and                            allergies were reviewed. The patient's tolerance of                            previous anesthesia was also reviewed. The risks                            and benefits of the procedure and the sedation                            options and risks were discussed with the patient.                            All questions were answered, and informed consent                            was obtained. Prior Anticoagulants: The patient has                            taken no previous anticoagulant or antiplatelet                            agents. ASA Grade Assessment: II - A patient with                            mild systemic disease. After reviewing the risks                            and benefits, the patient was deemed in                            satisfactory condition to undergo the procedure.                           After obtaining informed consent, the colonoscope  was passed under direct vision. Throughout the                            procedure, the patient's blood pressure, pulse, and                            oxygen saturations were monitored continuously. The                            Colonoscope was introduced through the anus and                            advanced to the the cecum, identified by                            appendiceal orifice and  ileocecal valve. The                            colonoscopy was performed without difficulty. The                            patient tolerated the procedure well. The quality                            of the bowel preparation was excellent. The                            ileocecal valve, appendiceal orifice, and rectum                            were photographed. Scope In: 8:34:49 AM Scope Out: 8:46:17 AM Scope Withdrawal Time: 0 hours 9 minutes 2 seconds  Total Procedure Duration: 0 hours 11 minutes 28 seconds  Findings:                 There was a medium sized lipoma, 20 mm in diameter,                            in the ascending colon.                           Multiple small-mouthed diverticula were found in                            the left colon.                           The exam was otherwise without abnormality on                            direct and retroflexion views. Complications:            No immediate complications. Estimated blood loss:                            None. Estimated Blood Loss:  Estimated blood loss: none. Impression:               - Lipoma in the ascending colon.                           - Diverticulosis in the left colon.                           - The examination was otherwise normal on direct                            and retroflexion views.                           - No polyps or cancers. Recommendation:           - Patient has a contact number available for                            emergencies. The signs and symptoms of potential                            delayed complications were discussed with the                            patient. Return to normal activities tomorrow.                            Written discharge instructions were provided to the                            patient.                           - Resume previous diet.                           - Continue present medications.                           - Repeat  colonoscopy in 10 years for screening. Milus Banister, MD 02/09/2020 8:48:29 AM This report has been signed electronically.

## 2020-02-09 NOTE — Progress Notes (Signed)
Report given to PACU, vss 

## 2020-02-09 NOTE — Progress Notes (Signed)
Temp by JB Vitals by CW  Pt's states no medical or surgical changes since previsit or office visit.  

## 2020-02-09 NOTE — Patient Instructions (Signed)
   HANDOUT ON DIVERTICULOSIS GIVEN TO YOU TODAY   YOU HAD AN ENDOSCOPIC PROCEDURE TODAY AT [facility]:   Refer to the procedure report that was given to you for any specific questions about what was found during the examination.  If the procedure report does not answer your questions, please call your gastroenterologist to clarify.  If you requested that your care partner not be given the details of your procedure findings, then the procedure report has been included in a sealed envelope for you to review at your convenience later.  YOU SHOULD EXPECT: Some feelings of bloating in the abdomen. Passage of more gas than usual.  Walking can help get rid of the air that was put into your GI tract during the procedure and reduce the bloating. If you had a lower endoscopy (such as a colonoscopy or flexible sigmoidoscopy) you may notice spotting of blood in your stool or on the toilet paper. If you underwent a bowel prep for your procedure, you may not have a normal bowel movement for a few days.  Please Note:  You might notice some irritation and congestion in your nose or some drainage.  This is from the oxygen used during your procedure.  There is no need for concern and it should clear up in a day or so.  SYMPTOMS TO REPORT IMMEDIATELY:   Following lower endoscopy (colonoscopy or flexible sigmoidoscopy):  Excessive amounts of blood in the stool  Significant tenderness or worsening of abdominal pains  Swelling of the abdomen that is new, acute  Fever of 100F or higher    For urgent or emergent issues, a gastroenterologist can be reached at any hour by calling [phone]. Do not use MyChart messaging for urgent concerns.    DIET:  We do recommend a small meal at first, but then you may proceed to your regular diet.  Drink plenty of fluids but you should avoid alcoholic beverages for 24 hours.  ACTIVITY:  You should plan to take it easy for the rest of today and you should  NOT DRIVE or use heavy machinery until tomorrow (because of the sedation medicines used during the test).    FOLLOW UP: Our staff will call the number listed on your records 48-72 hours following your procedure to check on you and address any questions or concerns that you may have regarding the information given to you following your procedure. If we do not reach you, we will leave a message.  We will attempt to reach you two times.  During this call, we will ask if you have developed any symptoms of COVID 19. If you develop any symptoms (ie: fever, flu-like symptoms, shortness of breath, cough etc.) before then, please call [phone].  If you test positive for Covid 19 in the 2 weeks post procedure, please call and report this information to [place].    If any biopsies were taken you will be contacted by phone or by letter within the next 1-3 weeks.  Please call us at [phone] if you have not heard about the biopsies in 3 weeks.    SIGNATURES/CONFIDENTIALITY: You and/or your care partner have signed paperwork which will be entered into your electronic medical record.  These signatures attest to the fact that that the information above on your After Visit Summary has been reviewed and is understood.  Full responsibility of the confidentiality of this discharge information lies with you and/or your care-partner.

## 2020-02-13 ENCOUNTER — Ambulatory Visit: Payer: 59 | Admitting: Gastroenterology

## 2020-02-13 ENCOUNTER — Telehealth: Payer: Self-pay

## 2020-02-13 NOTE — Telephone Encounter (Signed)
  Follow up Call-  Call back number 02/09/2020 05/02/2019  Post procedure Call Back phone  # [phone]  Permission to leave phone message Yes Yes  Some recent data might be hidden     Patient questions:  Do you have a fever, pain , or abdominal swelling? No. Pain Score  0 *  Have you tolerated food without any problems? Yes.    Have you been able to return to your normal activities? Yes.    Do you have any questions about your discharge instructions: Diet   No. Medications  No. Follow up visit  No.  Do you have questions or concerns about your Care? No.  Actions: * If pain score is 4 or above: No action needed, pain <4.  1. Have you developed a fever since your procedure? No  2.   Have you had an respiratory symptoms (SOB or cough) since your procedure? No  3.   Have you tested positive for COVID 19 since your procedure No  4.   Have you had any family members/close contacts diagnosed with the COVID 19 since your procedure?  No   If yes to any of these questions please route to Laverna Peace, RN and Charlett Lango, RN

## 2020-05-14 DIAGNOSIS — M4316 Spondylolisthesis, lumbar region: Secondary | ICD-10-CM | POA: Insufficient documentation

## 2020-05-28 ENCOUNTER — Other Ambulatory Visit: Payer: Self-pay

## 2020-05-28 ENCOUNTER — Other Ambulatory Visit (INDEPENDENT_AMBULATORY_CARE_PROVIDER_SITE_OTHER): Payer: No Typology Code available for payment source

## 2020-05-28 DIAGNOSIS — R197 Diarrhea, unspecified: Secondary | ICD-10-CM

## 2020-05-28 LAB — SEDIMENTATION RATE: Sed Rate: 8 mm/hr (ref 0–15)

## 2020-05-28 LAB — HIGH SENSITIVITY CRP: CRP, High Sensitivity: 5.55 mg/L — ABNORMAL HIGH (ref 0.000–5.000)

## 2021-10-16 ENCOUNTER — Ambulatory Visit (INDEPENDENT_AMBULATORY_CARE_PROVIDER_SITE_OTHER): Payer: No Typology Code available for payment source | Admitting: Podiatry

## 2021-10-16 ENCOUNTER — Ambulatory Visit: Payer: No Typology Code available for payment source

## 2021-10-16 ENCOUNTER — Encounter: Payer: Self-pay | Admitting: Podiatry

## 2021-10-16 ENCOUNTER — Other Ambulatory Visit: Payer: Self-pay

## 2021-10-16 DIAGNOSIS — B070 Plantar wart: Secondary | ICD-10-CM

## 2021-10-16 DIAGNOSIS — M7662 Achilles tendinitis, left leg: Secondary | ICD-10-CM

## 2021-10-16 DIAGNOSIS — L603 Nail dystrophy: Secondary | ICD-10-CM | POA: Diagnosis not present

## 2021-10-16 MED ORDER — FLUOROURACIL 5 % EX CREA
TOPICAL_CREAM | Freq: Two times a day (BID) | CUTANEOUS | 1 refills | Status: DC
Start: 2021-10-16 — End: 2022-04-30

## 2021-10-18 NOTE — Progress Notes (Signed)
Subjective:  Patient ID: Michael Hansen, male    DOB: 09/24/1971,  MRN: [id number] HPI Chief Complaint  Patient presents with   Foot Pain    Plantar heel left - callused lump x 1 week, but area has been sore for 2-3 weeks, tender with walking, no treatment   New Patient (Initial Visit)    Est pt 07/2018   Nail Problem    Hallux nails bilateral - discolored    50 y.o. male presents with the above complaint.   ROS: Denies fever chills nausea vomiting muscle aches pains calf pain back pain chest pain shortness of breath.  Past Medical History:  Diagnosis Date   Allergy    Anxiety    GERD (gastroesophageal reflux disease)    Plantar fasciitis    Past Surgical History:  Procedure Laterality Date   CHOLECYSTECTOMY     finger surgeryOther]     SEPTOPLASTY     TONSILLECTOMY     UPPER GASTROINTESTINAL ENDOSCOPY  05/02/2019    Current Outpatient Medications:    fluorouracil (EFUDEX) 5 % cream, Apply topically 2 (two) times daily., Disp: 40 g, Rfl: 1   escitalopram (LEXAPRO) 10 MG tablet, Take 10 mg by mouth daily., Disp: , Rfl: 1   famotidine (PEPCID) 40 MG tablet, , Disp: , Rfl:    fexofenadine (ALLEGRA) 180 MG tablet, Take by mouth., Disp: , Rfl:    fluticasone (FLONASE) 50 MCG/ACT nasal spray, Place into the nose., Disp: , Rfl:    omeprazole (PRILOSEC) 40 MG capsule, TAKE 1 CAPSULE (40 MG TOTAL) BY MOUTH 2 TIMES DAILY., Disp: , Rfl: 11  Allergies  Allergen Reactions   Tetanus Toxoids     Other reaction(s): Other   Doxycycline Rash   Levofloxacin Rash   Review of Systems Objective:  There were no vitals filed for this visit.  General: Well developed, nourished, in no acute distress, alert and oriented x3   Dermatological: Skin is warm, dry and supple bilateral. Nails x 10 are well maintained though there is some thickening and discoloration of the hallux nail bilaterally.  Left appears to be worse than right; remaining integument appears unremarkable at this time. There  are no open sores, no preulcerative lesions, no rash or signs of infection present.  He does however have small verrucoid lesion to the forefoot right with thrombosed capillaries and skin lines do circumvent the lesions.  There is also a small 1 on the same heel.  Both of these measure less than 5 mm in diameter.  He does have a thickened callused area to the plantar medial aspect of his left heel once debrided demonstrates skin lines that circumvent the lesion but no thrombosed capillaries as of yet.  It does have pain on medial and lateral compression.  This appears to be a primordial wart.  Vascular: Dorsalis Pedis artery and Posterior Tibial artery pedal pulses are 2/4 bilateral with immedate capillary fill time. Pedal hair growth present. No varicosities and no lower extremity edema present bilateral.   Neruologic: Grossly intact via light touch bilateral. Vibratory intact via tuning fork bilateral. Protective threshold with Semmes Wienstein monofilament intact to all pedal sites bilateral. Patellar and Achilles deep tendon reflexes 2+ bilateral. No Babinski or clonus noted bilateral.   Musculoskeletal: No gross boney pedal deformities bilateral. No pain, crepitus, or limitation noted with foot and ankle range of motion bilateral. Muscular strength 5/5 in all groups tested bilateral.  Gait: Unassisted, Nonantalgic.    Radiographs:  None taken  Assessment &  Plan:   Assessment: Verrucoid lesions plantar aspect right foot most painful plantar medial left heel appears to be primordial.  Nail dystrophy hallux bilateral  Plan: After sharp debridement today Cantharone was applied under occlusion to be left on until tomorrow washed off thoroughly.  I wrote a prescription for Efudex cream to be applied twice daily.  I would like to follow-up with him in a few weeks to make sure he is improving.  If this fails to alleviate his lesions that I think the best would be to curettage these.  Samples of  skin and nail were taken today for pathologic evaluation.     Michael Hansen T. [place], [place]

## 2021-10-29 ENCOUNTER — Encounter: Payer: Self-pay | Admitting: Podiatry

## 2021-10-29 ENCOUNTER — Other Ambulatory Visit: Payer: Self-pay

## 2021-10-29 ENCOUNTER — Ambulatory Visit (INDEPENDENT_AMBULATORY_CARE_PROVIDER_SITE_OTHER): Payer: No Typology Code available for payment source | Admitting: Podiatry

## 2021-10-29 DIAGNOSIS — B070 Plantar wart: Secondary | ICD-10-CM | POA: Diagnosis not present

## 2021-10-29 NOTE — Patient Instructions (Signed)
Wart Surgery-Directions for Home Care  You will need: Dial antibacterial hand soap,  gauze,  Band-aids  Keep the original bandage on until the following morning.  Bathe or shower with the bandage on allowing it to soak, so that when removed it won't stick to the wound. After showering or bathing, remove the old bandage and cleanse the area with Dial soap and water.  Place a few drops of Dial soap and water on a piece of guaze and gently scrub the area.  Dry with a clean piece of gauze. Apply antibiotic cream (polysporin, triple antibiotic or similar) to the area and place a clean square gauze bandage over and cover with a band-aid. In the evening, add a few drops of Dial soap to a basin of lukewarm water and soak your foot for 15 minutes.  After soaking, follow the instructions above for cleaning the area. Continue cleansing the area as described above two times a day, applying sterile gauze dressings until the doctor informs you that it is not needed. The time required to heal the surgical site will depend upon the size and location of the wart.  Lesions under bony prominences heal slower.  The average healing time is 2 to 4 weeks. Take over the counter Ibuprofen or Tylenol as needed should you experience any discomfort If you do experience discomfort after surgery, keep the foot elevated and apply an ice pack over your ankle, 30 minutes on, 30 minutes off each hour for the rest of the day. If you have any questions , please do not hesitate to contact the office.  WARTS (Verrucae)  Warts are caused by a virus that has invaded the skin.  They are more common in young adults and children and a small percentage will resolve on their own.  There are many types of warts including mosaic warts (large flat), vulgaris (domed warts-have pearl like appearance), and plantar warts (flat or cauliflower like appearance).  Warts are highly contagious and may be picked up from any surface.  Warts thrive in a warm  moist environment and are common near pools, showers, and locker room floors.  Any microscopic cut in the skin is where the virus enters and becomes a wart.  Warts are very difficult to treat and get rid of.  Patience is necessary in the treatment of this virus.  It may take months to cure and different methods may have to be used to get rid of your wart.  Standard Initial Treatment is: Periodic debridement of the wart and application of Canthacur to each lesion (a blistering agent that will slough off the warty skin) Dispensing of topical treatments/prescriptions to apply to the wart at home  Other options include: Excision of the lesion-numbing the skin around the wart and cutting it out-requires daily soaks post-operatively and takes about 2-3 weeks to fully heal Excision with CO2 Laser-Performed at the surgical center your foot is numbed up and the lesions are all cut out and then lasered with a high power laser.  Very good for multiple warts that are resistant. Cimetidine (Tagamet)-Oral agent used in high does--has shown better results in children  How do I apply the standard topical treatments?  Salicylic Acid (Compound W wart remover liquid or gel-available at drug or grocery stores)-Apply a dime size thickness over the wart and cover with duct tape-apply at night so the medication does not spread out to the good skin.  The skin will turn white and slowly blister off.  Use a   pumice stone daily to remove the white skin as best you can.  If the skin gets too raw and painful, discontinue for a few days then resume. Aldara (Imiquimod)-this is an immune response modifier.  They come in little packets so try to get at least 2 days out of each packet if you can.  Apply a small amount to the lesion and cover with duct tape.  Do not rub it in-let it absorb on its own.  Good to apply each morning.  Other Helpful Hints: Wash shoes that can be washed in the washing machine 2-3 x per month with some  bleach Use Lysol in shoes that cannot be washed and wipe out with a cloth 1 x per week-allow to dry for 8 hours before wearing again Use a bleach solution (1 part bleach to 3 parts water) in your tub or shower to reduce the spread of the virus to yourself and others Use aqua socks or clean sandals when at the pool or locker room to reduce the chance of picking up the virus or spreading it to others  

## 2021-10-30 NOTE — Progress Notes (Signed)
Romone presents today chief concern of that painful wart to the plantar aspect of the plantar medial heel left.  States is just not getting any better.  Objective: Vital signs are stable he is alert and oriented x3 I recommended that we go ahead and remove the wart.  Wart does demonstrate just demonstrate a primordial wart to the plantar medial aspect of the left heel right proximal to the arch exquisitely tender on direct as well as medial lateral compression.  Skin lines are starting circumvent the lesion I still see no thrombosed capillaries at this point.  Assessment: Verruca plantaris.  Plan: Surgical curettage today lesion measured less than a centimeter in diameter but was sent for pathologic evaluation did appear to be a wart.  He tolerated this procedure well after local anesthetic was administered he was dressed for the dressed a compressive dressing I will follow-up with him in 1 to 2 weeks at that time we hope to have the results of his toenails but as well

## 2021-11-12 ENCOUNTER — Ambulatory Visit: Payer: No Typology Code available for payment source | Admitting: Podiatry

## 2021-11-13 ENCOUNTER — Encounter (INDEPENDENT_AMBULATORY_CARE_PROVIDER_SITE_OTHER): Payer: Self-pay | Admitting: Podiatry

## 2021-11-13 NOTE — Progress Notes (Signed)
This encounter was created in error - please disregard.

## 2022-04-16 ENCOUNTER — Other Ambulatory Visit: Payer: Self-pay | Admitting: Family Medicine

## 2022-04-16 DIAGNOSIS — E785 Hyperlipidemia, unspecified: Secondary | ICD-10-CM

## 2022-04-27 ENCOUNTER — Ambulatory Visit
Admission: RE | Admit: 2022-04-27 | Discharge: 2022-04-27 | Disposition: A | Discharge status: Home / Self Care | Payer: No Typology Code available for payment source | Source: Ambulatory Visit

## 2022-04-27 DIAGNOSIS — E785 Hyperlipidemia, unspecified: Secondary | ICD-10-CM

## 2022-04-30 ENCOUNTER — Ambulatory Visit (INDEPENDENT_AMBULATORY_CARE_PROVIDER_SITE_OTHER): Payer: No Typology Code available for payment source | Admitting: Podiatry

## 2022-04-30 DIAGNOSIS — B070 Plantar wart: Secondary | ICD-10-CM

## 2022-04-30 MED ORDER — FLUOROURACIL 5 % EX CREA: TOPICAL_CREAM | Freq: Two times a day (BID) | CUTANEOUS | 2 refills | Status: DC

## 2022-04-30 MED ORDER — CIMETIDINE 800 MG PO TABS: 800.0000 mg | ORAL_TABLET | Freq: Every day | ORAL | 1 refills | Status: DC

## 2022-05-01 NOTE — Progress Notes (Signed)
Subjective:   Patient ID: Michael Hansen, male   DOB: 51 y.o.   MRN: [id number]   HPI Patient presents with intense discomfort plantar lateral aspect of the left foot and did have a mass removed in a different area last year and states he is frustrated by other warts that do pop up are not currently painful   ROS      Objective:  Physical Exam  Neurovascular status intact with large mass plantar lateral aspect left foot consistent with previous ones measuring about 1.1 cm x 8 mm painful to lateral pressure pinpoint bleeding upon debridement with several other lesions noted bilateral     Assessment:  Verruca plantaris exquisite discomfort plantar left with several other lesions pop and     Plan:  8 NP reviewed I do think removal can be considered for topical medicine and I reviewed the pros and cons of both and he has opted for removal and I went ahead today did do an anesthesia 60 mg like Marcaine mixture with epinephrine around the area and then went ahead using surgical technique I excised the mass I did clean the base of it and it is typical verruca presentation is already had 1 sent to pathology indicating benign process most likely this being the same and patient does not want it sent to pathology.  Applied phenol to the base applied sterile dressing applied padding instructed it may recur and I also started him on Tagamet and Efudex.  Patient will be seen back to recheck encouraged to call questions concerns

## 2022-05-07 ENCOUNTER — Other Ambulatory Visit: Payer: Self-pay | Admitting: Family Medicine

## 2022-05-07 DIAGNOSIS — R9389 Abnormal findings on diagnostic imaging of other specified body structures: Secondary | ICD-10-CM

## 2022-05-12 ENCOUNTER — Ambulatory Visit
Admission: RE | Admit: 2022-05-12 | Discharge: 2022-05-12 | Disposition: A | Discharge status: Home / Self Care | Payer: No Typology Code available for payment source | Source: Ambulatory Visit | Attending: Family Medicine | Admitting: Family Medicine

## 2022-05-12 DIAGNOSIS — R9389 Abnormal findings on diagnostic imaging of other specified body structures: Secondary | ICD-10-CM

## 2022-05-18 DIAGNOSIS — G4733 Obstructive sleep apnea (adult) (pediatric): Secondary | ICD-10-CM | POA: Insufficient documentation

## 2022-05-23 ENCOUNTER — Other Ambulatory Visit: Payer: Self-pay | Admitting: Podiatry

## 2023-08-23 DIAGNOSIS — M47816 Spondylosis without myelopathy or radiculopathy, lumbar region: Secondary | ICD-10-CM | POA: Insufficient documentation

## 2024-04-04 DIAGNOSIS — I10 Essential (primary) hypertension: Secondary | ICD-10-CM | POA: Diagnosis not present

## 2024-04-04 DIAGNOSIS — G4733 Obstructive sleep apnea (adult) (pediatric): Secondary | ICD-10-CM | POA: Diagnosis not present

## 2024-04-04 DIAGNOSIS — F419 Anxiety disorder, unspecified: Secondary | ICD-10-CM | POA: Diagnosis not present

## 2024-04-04 DIAGNOSIS — Z9189 Other specified personal risk factors, not elsewhere classified: Secondary | ICD-10-CM | POA: Diagnosis not present

## 2024-05-18 DIAGNOSIS — G4733 Obstructive sleep apnea (adult) (pediatric): Secondary | ICD-10-CM | POA: Diagnosis not present

## 2024-05-18 DIAGNOSIS — R632 Polyphagia: Secondary | ICD-10-CM | POA: Diagnosis not present

## 2024-05-18 DIAGNOSIS — F439 Reaction to severe stress, unspecified: Secondary | ICD-10-CM | POA: Diagnosis not present

## 2024-05-18 DIAGNOSIS — I10 Essential (primary) hypertension: Secondary | ICD-10-CM | POA: Diagnosis not present

## 2024-05-23 DIAGNOSIS — G4733 Obstructive sleep apnea (adult) (pediatric): Secondary | ICD-10-CM | POA: Diagnosis not present

## 2024-05-23 DIAGNOSIS — K219 Gastro-esophageal reflux disease without esophagitis: Secondary | ICD-10-CM | POA: Diagnosis not present

## 2024-06-27 DIAGNOSIS — F411 Generalized anxiety disorder: Secondary | ICD-10-CM | POA: Diagnosis not present

## 2024-07-04 DIAGNOSIS — F411 Generalized anxiety disorder: Secondary | ICD-10-CM | POA: Diagnosis not present

## 2024-07-11 DIAGNOSIS — F411 Generalized anxiety disorder: Secondary | ICD-10-CM | POA: Diagnosis not present

## 2024-07-25 DIAGNOSIS — F411 Generalized anxiety disorder: Secondary | ICD-10-CM | POA: Diagnosis not present

## 2024-08-01 DIAGNOSIS — F411 Generalized anxiety disorder: Secondary | ICD-10-CM | POA: Diagnosis not present

## 2024-08-08 DIAGNOSIS — F411 Generalized anxiety disorder: Secondary | ICD-10-CM | POA: Diagnosis not present

## 2024-08-15 DIAGNOSIS — F411 Generalized anxiety disorder: Secondary | ICD-10-CM | POA: Diagnosis not present

## 2024-08-29 DIAGNOSIS — F411 Generalized anxiety disorder: Secondary | ICD-10-CM | POA: Diagnosis not present

## 2024-08-31 DIAGNOSIS — M25561 Pain in right knee: Secondary | ICD-10-CM | POA: Diagnosis not present

## 2024-09-05 DIAGNOSIS — F411 Generalized anxiety disorder: Secondary | ICD-10-CM | POA: Diagnosis not present

## 2024-09-05 DIAGNOSIS — N200 Calculus of kidney: Secondary | ICD-10-CM | POA: Diagnosis not present

## 2024-09-07 DIAGNOSIS — Z630 Problems in relationship with spouse or partner: Secondary | ICD-10-CM | POA: Diagnosis not present

## 2024-09-12 DIAGNOSIS — F411 Generalized anxiety disorder: Secondary | ICD-10-CM | POA: Diagnosis not present

## 2024-09-13 DIAGNOSIS — M25561 Pain in right knee: Secondary | ICD-10-CM | POA: Diagnosis not present

## 2024-09-13 DIAGNOSIS — Z630 Problems in relationship with spouse or partner: Secondary | ICD-10-CM | POA: Diagnosis not present

## 2024-10-03 DIAGNOSIS — F411 Generalized anxiety disorder: Secondary | ICD-10-CM | POA: Diagnosis not present

## 2024-10-04 ENCOUNTER — Other Ambulatory Visit (HOSPITAL_COMMUNITY): Payer: Self-pay | Admitting: Family Medicine

## 2024-10-04 DIAGNOSIS — F411 Generalized anxiety disorder: Secondary | ICD-10-CM | POA: Diagnosis not present

## 2024-10-04 DIAGNOSIS — Z630 Problems in relationship with spouse or partner: Secondary | ICD-10-CM | POA: Diagnosis not present

## 2024-10-04 DIAGNOSIS — M25561 Pain in right knee: Secondary | ICD-10-CM

## 2024-10-12 ENCOUNTER — Ambulatory Visit (HOSPITAL_COMMUNITY): Attending: Family Medicine

## 2024-10-12 ENCOUNTER — Encounter (HOSPITAL_COMMUNITY): Payer: Self-pay

## 2024-10-17 DIAGNOSIS — F411 Generalized anxiety disorder: Secondary | ICD-10-CM | POA: Diagnosis not present

## 2024-10-23 DIAGNOSIS — F411 Generalized anxiety disorder: Secondary | ICD-10-CM | POA: Diagnosis not present

## 2024-10-24 DIAGNOSIS — S83241A Other tear of medial meniscus, current injury, right knee, initial encounter: Secondary | ICD-10-CM | POA: Diagnosis not present

## 2024-10-30 ENCOUNTER — Encounter: Payer: Self-pay | Admitting: Family Medicine

## 2024-10-30 ENCOUNTER — Ambulatory Visit: Admitting: Family Medicine

## 2024-10-30 VITALS — BP 132/84 | HR 80 | Temp 98.0°F | Ht 75.0 in | Wt 246.0 lb

## 2024-10-30 DIAGNOSIS — E66811 Obesity, class 1: Secondary | ICD-10-CM | POA: Diagnosis not present

## 2024-10-30 DIAGNOSIS — F411 Generalized anxiety disorder: Secondary | ICD-10-CM

## 2024-10-30 DIAGNOSIS — I10 Essential (primary) hypertension: Secondary | ICD-10-CM

## 2024-10-30 DIAGNOSIS — G4733 Obstructive sleep apnea (adult) (pediatric): Secondary | ICD-10-CM

## 2024-10-30 DIAGNOSIS — M25561 Pain in right knee: Secondary | ICD-10-CM

## 2024-10-30 DIAGNOSIS — Z6830 Body mass index (BMI) 30.0-30.9, adult: Secondary | ICD-10-CM | POA: Diagnosis not present

## 2024-10-30 NOTE — Progress Notes (Unsigned)
 TRY FOR VIAL 15 MG ZEPBOUND  KNEE RIGHT [employer]

## 2024-10-31 DIAGNOSIS — F411 Generalized anxiety disorder: Secondary | ICD-10-CM | POA: Diagnosis not present

## 2024-11-01 DIAGNOSIS — Z6830 Body mass index (BMI) 30.0-30.9, adult: Secondary | ICD-10-CM | POA: Insufficient documentation

## 2024-11-01 DIAGNOSIS — I10 Essential (primary) hypertension: Secondary | ICD-10-CM | POA: Insufficient documentation

## 2024-11-01 MED ORDER — LOSARTAN POTASSIUM-HCTZ 50-12.5 MG PO TABS: 1.0000 | ORAL_TABLET | Freq: Every day | ORAL | 3 refills | Status: AC

## 2024-11-01 MED ORDER — ZEPBOUND 15 MG/0.5ML ~~LOC~~ SOAJ: 15.0000 mg | SUBCUTANEOUS | 3 refills | Status: AC

## 2024-11-01 MED ORDER — ESCITALOPRAM OXALATE 10 MG PO TABS: 10.0000 mg | ORAL_TABLET | Freq: Every day | ORAL | 3 refills | Status: AC

## 2024-11-03 ENCOUNTER — Other Ambulatory Visit (HOSPITAL_COMMUNITY): Payer: Self-pay

## 2024-11-08 ENCOUNTER — Telehealth: Payer: Self-pay

## 2024-11-08 ENCOUNTER — Other Ambulatory Visit (HOSPITAL_COMMUNITY): Payer: Self-pay

## 2024-11-08 NOTE — Telephone Encounter (Signed)
 Pharmacy Patient Advocate Encounter   Received notification from CoverMyMeds that prior authorization for Zepbound  15mg /0.22ml is required/requested.   Insurance verification completed.   The patient is insured through [facility].   Per test claim: Per test claim, medication is not covered due to plan/benefit exclusion, PA not submitted at this time

## 2024-11-09 NOTE — Telephone Encounter (Signed)
 Dr Prentiss, sending this to you as an FYI. Do you remember if [employer] usually pays out of pocket? I couldn't remember. The message says that Zepbound  is a plan exclusion so I PA was not started.

## 2024-12-11 ENCOUNTER — Ambulatory Visit: Admitting: Family Medicine
